# Patient Record
Sex: Male | Born: 1990 | Race: Black or African American | Hispanic: No | Marital: Single | Smoking: Former smoker
Health system: Southern US, Community
[De-identification: ages and names within clinical notes are randomized; demographics above are authoritative.]

## PROBLEM LIST (undated history)

## (undated) DIAGNOSIS — U071 COVID-19: Secondary | ICD-10-CM

## (undated) HISTORY — DX: COVID-19: U07.1

## (undated) HISTORY — PX: OTHER SURGICAL HISTORY: SHX169

## (undated) HISTORY — PX: HERNIA REPAIR: SHX51

---

## 2016-05-28 ENCOUNTER — Encounter (HOSPITAL_COMMUNITY): Payer: Self-pay | Admitting: Emergency Medicine

## 2016-05-28 ENCOUNTER — Emergency Department (HOSPITAL_COMMUNITY)
Admission: EM | Admit: 2016-05-28 | Discharge: 2016-05-28 | Disposition: A | Discharge status: Home / Self Care | Payer: Self-pay | Attending: Emergency Medicine | Admitting: Emergency Medicine

## 2016-05-28 DIAGNOSIS — L0201 Cutaneous abscess of face: Secondary | ICD-10-CM | POA: Insufficient documentation

## 2016-05-28 DIAGNOSIS — L0291 Cutaneous abscess, unspecified: Secondary | ICD-10-CM

## 2016-05-28 MED ORDER — ONDANSETRON HCL 4 MG PO TABS
4.0000 mg | ORAL_TABLET | Freq: Once | ORAL | Status: AC
  Administered 2016-05-28: 4 mg via ORAL
  Filled 2016-05-28: qty 1

## 2016-05-28 MED ORDER — IBUPROFEN 600 MG PO TABS: 600.0000 mg | ORAL_TABLET | Freq: Four times a day (QID) | ORAL | 0 refills | Status: DC

## 2016-05-28 MED ORDER — HYDROCODONE-ACETAMINOPHEN 5-325 MG PO TABS: 1.0000 | ORAL_TABLET | ORAL | 0 refills | Status: DC | PRN

## 2016-05-28 MED ORDER — DOXYCYCLINE HYCLATE 100 MG PO TABS
100.0000 mg | ORAL_TABLET | Freq: Once | ORAL | Status: AC
  Administered 2016-05-28: 100 mg via ORAL
  Filled 2016-05-28: qty 1

## 2016-05-28 MED ORDER — PENTAFLUOROPROP-TETRAFLUOROETH EX AERO
INHALATION_SPRAY | Freq: Once | CUTANEOUS | Status: AC
  Administered 2016-05-28: 11:00:00 via TOPICAL
  Filled 2016-05-28: qty 103.5

## 2016-05-28 MED ORDER — SULFAMETHOXAZOLE-TRIMETHOPRIM 800-160 MG PO TABS: 1.0000 | ORAL_TABLET | Freq: Two times a day (BID) | ORAL | 0 refills | Status: AC

## 2016-05-28 MED ORDER — ACETAMINOPHEN 500 MG PO TABS
1000.0000 mg | ORAL_TABLET | Freq: Once | ORAL | Status: AC
  Administered 2016-05-28: 1000 mg via ORAL
  Filled 2016-05-28: qty 2

## 2016-05-28 MED ORDER — IBUPROFEN 800 MG PO TABS
800.0000 mg | ORAL_TABLET | Freq: Once | ORAL | Status: AC
  Administered 2016-05-28: 800 mg via ORAL
  Filled 2016-05-28: qty 1

## 2016-05-28 NOTE — ED Triage Notes (Signed)
Pt reports 2 abscesses on neck bilaterally.  Began 2 days ago.

## 2016-05-28 NOTE — Discharge Instructions (Signed)
Please cleanse the 2 areas on the side of your jaw and face with warm water and soap on. Dry thoroughly, and apply a Band-Aid to each. Do this until these wounds have healed. Please use Bactrim 2 times daily with food. Use ibuprofen with breakfast, lunch, dinner, and at bedtime. Use Norco for more severe pain. Norco may cause drowsiness, please do not drive, drink, operate machinery, or participate in activities requiring concentration when taking this medication. Please see your primary physician, or return to the emergency department if any signs of advancing infection or change in your general condition.

## 2016-05-28 NOTE — ED Provider Notes (Signed)
[facility]-EMERGENCY DEPT Provider Note   CSN: [id number] Arrival date & time: 05/28/16  1003     History   Chief Complaint Chief Complaint  Patient presents with  . Abscess    HPI Adam Rogers is a 25 y.o. male.  Patient is a 25 year old male who presents to the emergency department with complaint of abscess of the neck and face.  The patient states that approximately 2 days ago he noticed a shaving bump on the right and the left side of his jaw and one on the upper portion of his neck. He states that he tried warm compresses. He tried squeezing the area. Today he has an abscess on either side of that is very painful. He has not noted red streaking noted. He is not had any difficulty with swallowing or speaking. He does not have any medical conditions that would interfere with his immune system. He denies diabetes. He presents now for assistance with this issue.   The history is provided by the patient.  Abscess    History reviewed. No pertinent past medical history.  There are no active problems to display for this patient.   History reviewed. No pertinent surgical history.     Home Medications    Prior to Admission medications   Not on File    Family History History reviewed. No pertinent family history.  Social History Social History  Substance Use Topics  . Smoking status: Never Smoker  . Smokeless tobacco: Never Used  . Alcohol use No     Allergies   Patient has no known allergies.   Review of Systems Review of Systems  Constitutional: Negative for activity change.       All ROS Neg except as noted in HPI  HENT: Negative for nosebleeds.   Eyes: Negative for photophobia and discharge.  Respiratory: Negative for cough, shortness of breath and wheezing.   Cardiovascular: Negative for chest pain and palpitations.  Gastrointestinal: Negative for abdominal pain and blood in stool.  Genitourinary: Negative for dysuria, frequency and hematuria.    Musculoskeletal: Negative for arthralgias, back pain and neck pain.  Skin: Positive for wound.  Neurological: Negative for dizziness, seizures and speech difficulty.  Psychiatric/Behavioral: Negative for confusion and hallucinations.     Physical Exam Updated Vital Signs BP 126/69   Pulse 79   Temp 98.7 F (37.1 C) (Oral)   Resp 18   Ht 6' (1.829 m)   Wt 65.8 kg   SpO2 100%   BMI 19.67 kg/m   Physical Exam  Constitutional: He is oriented to person, place, and time. He appears well-developed and well-nourished.  Non-toxic appearance.  HENT:  Head: Normocephalic.    Right Ear: Tympanic membrane and external ear normal.  Left Ear: Tympanic membrane and external ear normal.  Eyes: EOM and lids are normal. Pupils are equal, round, and reactive to light.  Neck: Normal range of motion. Neck supple. Carotid bruit is not present.  Cardiovascular: Normal rate, regular rhythm, normal heart sounds, intact distal pulses and normal pulses.   Pulmonary/Chest: Breath sounds normal. No respiratory distress.  Abdominal: Soft. Bowel sounds are normal. There is no tenderness. There is no guarding.  Musculoskeletal: Normal range of motion.  Lymphadenopathy:       Head (right side): No submandibular adenopathy present.       Head (left side): No submandibular adenopathy present.    He has no cervical adenopathy.  Neurological: He is alert and oriented to person, place, and time. He has  normal strength. No cranial nerve deficit or sensory deficit.  Skin: Skin is warm and dry.  Psychiatric: He has a normal mood and affect. His speech is normal.  Nursing note and vitals reviewed.    ED Treatments / Results  Labs (all labs ordered are listed, but only abnormal results are displayed) Labs Reviewed  AEROBIC CULTURE (SUPERFICIAL SPECIMEN)    EKG  EKG Interpretation None       Radiology No results found.  Procedures .Marland Kitchen.Incision and Drainage Date/Time: 05/28/2016 11:54  AM Performed by: Ivery QualeBRYANT, Alexie Lanni Authorized by: Ivery QualeBRYANT, Gwenette Wellons   Consent:    Consent obtained:  Verbal   Consent given by:  Patient   Risks discussed:  Bleeding, incomplete drainage and pain   Alternatives discussed:  Referral Location:    Type:  Abscess   Location: right and left face/jaw. Pre-procedure details:    Skin preparation:  Chloraprep Anesthesia (see MAR for exact dosages):    Anesthesia method:  Topical application   Topical anesthesia: Gebaurer Spray. Procedure type:    Complexity:  Simple Procedure details:    Incision types:  Stab incision   Incision depth:  Dermal   Scalpel blade:  11   Wound management:  Probed and deloculated   Drainage:  Purulent and bloody   Wound treatment:  Wound left open   Packing materials:  None Post-procedure details:    Patient tolerance of procedure:  Tolerated well, no immediate complications   (including critical care time)  Medications Ordered in ED Medications  doxycycline (VIBRA-TABS) tablet 100 mg (not administered)  acetaminophen (TYLENOL) tablet 1,000 mg (not administered)  ibuprofen (ADVIL,MOTRIN) tablet 800 mg (not administered)  ondansetron (ZOFRAN) tablet 4 mg (not administered)  pentafluoroprop-tetrafluoroeth (GEBAUERS) aerosol (not administered)     Initial Impression / Assessment and Plan / ED Course  I have reviewed the triage vital signs and the nursing notes.  Pertinent labs & imaging results that were available during my care of the patient were reviewed by me and considered in my medical decision making (see chart for details).  Clinical Course     **I have reviewed nursing notes, vital signs, and all appropriate lab and imaging results for this patient.*  Final Clinical Impressions(s) / ED Diagnoses   Final diagnoses:  Abscess    New Prescriptions Discharge Medication List as of 05/28/2016 11:51 AM    START taking these medications   Details  HYDROcodone-acetaminophen (NORCO/VICODIN) 5-325  MG tablet Take 1 tablet by mouth every 4 (four) hours as needed., Starting Mon 05/28/2016, Print    ibuprofen (ADVIL,MOTRIN) 600 MG tablet Take 1 tablet (600 mg total) by mouth 4 (four) times daily., Starting Mon 05/28/2016, Print    sulfamethoxazole-trimethoprim (BACTRIM DS,SEPTRA DS) 800-160 MG tablet Take 1 tablet by mouth 2 (two) times daily., Starting Mon 05/28/2016, Until Mon 06/04/2016, Print         Ivery QualeHobson Nara Paternoster, PA-C 05/28/16 1158    Margarita Grizzleanielle Ray, MD 05/28/16 1501

## 2016-05-31 LAB — AEROBIC CULTURE W GRAM STAIN (SUPERFICIAL SPECIMEN)

## 2016-05-31 LAB — AEROBIC CULTURE  (SUPERFICIAL SPECIMEN): SPECIAL REQUESTS: NORMAL

## 2016-06-01 ENCOUNTER — Telehealth (HOSPITAL_BASED_OUTPATIENT_CLINIC_OR_DEPARTMENT_OTHER): Payer: Self-pay

## 2016-06-01 NOTE — Telephone Encounter (Signed)
Post ED Visit - Positive Culture Follow-up  Culture report reviewed by antimicrobial stewardship pharmacist:  []  Enzo BiNathan Batchelder, Pharm.D. []  Celedonio MiyamotoJeremy Frens, Pharm.D., BCPS []  Garvin FilaMike Maccia, Pharm.D. []  Georgina PillionElizabeth Martin, Pharm.D., BCPS []  MarathonMinh Pham, [address]Ph[address]D., BCPS, AAHIVP []  Estella HuskMichelle Turner, Pharm.D., BCPS, AAHIVP []  Tennis Mustassie Stewart, Pharm.D. []  Sherle Poeob Vincent, [address]Ph[address]Milas Gain. X  James Johnston, Pharm.D.  Positive aerobic culture -> rare staph aureus Treated with sulfa trimeth, organism sensitive to the same and no further patient follow-up is required at this time.  Arvid RightClark, Winni Ehrhard Dorn 06/01/2016, 5:08 PM

## 2020-07-09 ENCOUNTER — Emergency Department (HOSPITAL_COMMUNITY): Payer: Self-pay

## 2020-07-09 ENCOUNTER — Encounter (HOSPITAL_COMMUNITY): Payer: Self-pay

## 2020-07-09 ENCOUNTER — Other Ambulatory Visit: Payer: Self-pay

## 2020-07-09 ENCOUNTER — Emergency Department (HOSPITAL_COMMUNITY)
Admission: EM | Admit: 2020-07-09 | Discharge: 2020-07-09 | Disposition: A | Discharge status: Home / Self Care | Payer: Self-pay | Attending: Emergency Medicine | Admitting: Emergency Medicine

## 2020-07-09 DIAGNOSIS — Z8616 Personal history of COVID-19: Secondary | ICD-10-CM | POA: Insufficient documentation

## 2020-07-09 DIAGNOSIS — R0789 Other chest pain: Secondary | ICD-10-CM | POA: Insufficient documentation

## 2020-07-09 LAB — BASIC METABOLIC PANEL
Anion gap: 9 (ref 5–15)
BUN: 7 mg/dL (ref 6–20)
CO2: 25 mmol/L (ref 22–32)
Calcium: 9.2 mg/dL (ref 8.9–10.3)
Chloride: 99 mmol/L (ref 98–111)
Creatinine, Ser: 0.95 mg/dL (ref 0.61–1.24)
GFR, Estimated: >60 mL/min (ref >60)
Glucose, Bld: 103 mg/dL — ABNORMAL HIGH (ref 70–99)
Potassium: 3.7 mmol/L (ref 3.5–5.1)
Sodium: 133 mmol/L — ABNORMAL LOW (ref 135–145)

## 2020-07-09 LAB — CBC
HCT: 47.3 % (ref 39.0–52.0)
Hemoglobin: 15.9 g/dL (ref 13.0–17.0)
MCH: 31.9 pg (ref 26.0–34.0)
MCHC: 33.6 g/dL (ref 30.0–36.0)
MCV: 94.8 fL (ref 80.0–100.0)
Platelets: 237 10*3/uL (ref 150–400)
RBC: 4.99 MIL/uL (ref 4.22–5.81)
RDW: 12 % (ref 11.5–15.5)
WBC: 6.6 10*3/uL (ref 4.0–10.5)
nRBC: 0 % (ref 0.0–0.2)

## 2020-07-09 LAB — TROPONIN I (HIGH SENSITIVITY)
Troponin I (High Sensitivity): <2 ng/L (ref <18)
Troponin I (High Sensitivity): <2 ng/L (ref <18)

## 2020-07-09 MED ORDER — IOHEXOL 350 MG/ML SOLN
75.0000 mL | Freq: Once | INTRAVENOUS | Status: AC | PRN
  Administered 2020-07-09: 75 mL via INTRAVENOUS

## 2020-07-09 NOTE — Discharge Instructions (Addendum)
Work-up today very normal.  No evidence of any stress on the heart.  CAT scan of the lungs showed no blood clots in the lungs no signs of pneumonia.  Make an appointment to follow-up with cardiology for further work-up.

## 2020-07-09 NOTE — ED Triage Notes (Signed)
Pt reports chest pain for one year that comes and goes. Pt reports this pain he currently has started Monday and described as sharp

## 2020-07-09 NOTE — ED Provider Notes (Signed)
[facility] EMERGENCY DEPARTMENT Provider Note   CSN: [id number] Arrival date & time: 07/09/20  1829     History Chief Complaint  Patient presents with  . Chest Pain    Adam Rogers is a 30 y.o. male.  Patient diagnosed with Covid infection January 8.  Patient had some ongoing chest pain for the past several months even prior to that diagnosis.  But is gotten worse since.  Patient's been evaluated at [facility] for this.  Does not appear that he had a CT of his chest.  Patient states that when the pain occurs it is left anterior it can last for hours.  But it is not there every day.  Denies any leg swelling denies any shortness of breath.  Patient is very concerned that something is wrong        History reviewed. No pertinent past medical history.  There are no problems to display for this patient.   History reviewed. No pertinent surgical history.     No family history on file.  Social History   Tobacco Use  . Smoking status: Never Smoker  . Smokeless tobacco: Never Used  Substance Use Topics  . Alcohol use: No  . Drug use: No    Home Medications Prior to Admission medications   Medication Sig Start Date End Date Taking? Authorizing Provider  HYDROcodone-acetaminophen (NORCO/VICODIN) 5-325 MG tablet Take 1 tablet by mouth every 4 (four) hours as needed. 05/28/16   Ivery Quale, PA-C  ibuprofen (ADVIL,MOTRIN) 600 MG tablet Take 1 tablet (600 mg total) by mouth 4 (four) times daily. 05/28/16   Ivery Quale, PA-C    Allergies    Patient has no known allergies.  Review of Systems   Review of Systems  Constitutional: Negative for chills and fever.  HENT: Negative for congestion, rhinorrhea and sore throat.   Eyes: Negative for visual disturbance.  Respiratory: Negative for cough and shortness of breath.   Cardiovascular: Positive for chest pain. Negative for leg swelling.  Gastrointestinal: Negative for abdominal pain, diarrhea, nausea and vomiting.   Genitourinary: Negative for dysuria.  Musculoskeletal: Negative for back pain and neck pain.  Skin: Negative for rash.  Neurological: Negative for dizziness, light-headedness and headaches.  Hematological: Does not bruise/bleed easily.  Psychiatric/Behavioral: Negative for confusion.    Physical Exam Updated Vital Signs BP 125/87   Pulse 72   Temp 99.2 F (37.3 C) (Oral)   Resp 16   Ht 1.803 m (5\' 11" )   Wt 65.8 kg   SpO2 99%   BMI 20.22 kg/m   Physical Exam Vitals and nursing note reviewed.  Constitutional:      General: He is not in acute distress.    Appearance: He is well-developed and well-nourished.  HENT:     Head: Normocephalic and atraumatic.  Eyes:     Extraocular Movements: Extraocular movements intact.     Conjunctiva/sclera: Conjunctivae normal.     Pupils: Pupils are equal, round, and reactive to light.  Cardiovascular:     Rate and Rhythm: Normal rate and regular rhythm.     Heart sounds: No murmur heard.   Pulmonary:     Effort: Pulmonary effort is normal. No respiratory distress.     Breath sounds: Normal breath sounds.  Chest:     Chest wall: No tenderness.  Abdominal:     Palpations: Abdomen is soft.     Tenderness: There is no abdominal tenderness.  Musculoskeletal:        General: No  edema.     Cervical back: Neck supple.     Right lower leg: No edema.     Left lower leg: No edema.  Skin:    General: Skin is warm and dry.     Capillary Refill: Capillary refill takes less than 2 seconds.  Neurological:     General: No focal deficit present.     Mental Status: He is alert and oriented to person, place, and time.     Cranial Nerves: No cranial nerve deficit.     Sensory: No sensory deficit.  Psychiatric:        Mood and Affect: Mood and affect normal.     ED Results / Procedures / Treatments   Labs (all labs ordered are listed, but only abnormal results are displayed) Labs Reviewed  BASIC METABOLIC PANEL - Abnormal; Notable for  the following components:      Result Value   Sodium 133 (*)    Glucose, Bld 103 (*)    All other components within normal limits  CBC  TROPONIN I (HIGH SENSITIVITY)  TROPONIN I (HIGH SENSITIVITY)    EKG EKG Interpretation  Date/Time:  Saturday July 09 2020 10:44:55 EST Ventricular Rate:  79 PR Interval:  142 QRS Duration: 84 QT Interval:  384 QTC Calculation: 440 R Axis:   91 Text Interpretation: Normal sinus rhythm Rightward axis Borderline ECG No previous ECGs available Confirmed by Vanetta Mulders [phone]) on 07/09/2020 11:14:05 AM   Radiology DG Chest 2 View  Result Date: 07/09/2020 CLINICAL DATA:  Chest pain for 1 year, worse for 4 days EXAM: CHEST - 2 VIEW COMPARISON:  None. FINDINGS: Normal heart size and mediastinal contours. No acute infiltrate or edema. Artifact from EKG leads. No effusion or pneumothorax. No acute osseous findings. IMPRESSION: No active cardiopulmonary disease. Electronically Signed   By: Marnee Spring M.D.   On: 07/09/2020 11:09   CT Angio Chest PE W/Cm &/Or Wo Cm  Result Date: 07/09/2020 CLINICAL DATA:  Suspected PE. Left-sided chest pain for 1 year, worsening in the last 4 days. EXAM: CT ANGIOGRAPHY CHEST WITH CONTRAST TECHNIQUE: Multidetector CT imaging of the chest was performed using the standard protocol during bolus administration of intravenous contrast. Multiplanar CT image reconstructions and MIPs were obtained to evaluate the vascular anatomy. CONTRAST:  41mL OMNIPAQUE IOHEXOL 350 MG/ML SOLN COMPARISON:  None. FINDINGS: Cardiovascular: Satisfactory opacification of the pulmonary arteries to the segmental level. No evidence of pulmonary embolism. Normal heart size. No pericardial effusion. Mediastinum/Nodes: No enlarged mediastinal, hilar, or axillary lymph nodes. Thyroid gland, trachea, and esophagus demonstrate no significant findings. Lungs/Pleura: Lungs are clear. No pleural effusion or pneumothorax. Upper Abdomen: No acute abnormality.  Musculoskeletal: No chest wall abnormality. No acute or significant osseous findings. Review of the MIP images confirms the above findings. IMPRESSION: No evidence of pulmonary embolus or other acute abnormality within the thorax. Electronically Signed   By: Ted Mcalpine M.D.   On: 07/09/2020 13:17    Procedures Procedures   Medications Ordered in ED Medications  iohexol (OMNIPAQUE) 350 MG/ML injection 75 mL (75 mLs Intravenous Contrast Given 07/09/20 1312)    ED Course  I have reviewed the triage vital signs and the nursing notes.  Pertinent labs & imaging results that were available during my care of the patient were reviewed by me and considered in my medical decision making (see chart for details).    MDM Rules/Calculators/A&P  Work-up here troponins x2 - EKG without any acute findings.  Regular chest x-ray without any acute findings.  Went on and did CT angio to rule out pulmonary embolus since he recently had Covid infection.  No evidence of any pneumonia no evidence of pulmonary embolus.  Patient's basic labs without any significant abnormalities.  No leukocytosis no anemia.  Patient stable for discharge home.  Will give referral to cardiology to consider doing echocardiogram.  Not sure if they wanted do a stress test or not at his age.  But patient stable for discharge home.  Final Clinical Impression(s) / ED Diagnoses Final diagnoses:  Atypical chest pain    Rx / DC Orders ED Discharge Orders    None       Vanetta Mulders, MD 07/09/20 1438

## 2020-07-11 ENCOUNTER — Telehealth: Payer: Self-pay

## 2020-07-13 NOTE — Telephone Encounter (Signed)
error 

## 2020-08-16 ENCOUNTER — Ambulatory Visit: Payer: Self-pay | Admitting: Internal Medicine

## 2020-08-19 ENCOUNTER — Encounter: Payer: Self-pay | Admitting: Cardiology

## 2020-08-19 NOTE — Progress Notes (Signed)
Cardiology Office Note  Date: 08/22/2020   ID: Adam Rogers, DOB [birth year], MRN [id number]  PCP:  Patient, No Pcp Per  Cardiologist:  Nona Dell, MD Electrophysiologist:  None   Chief Complaint  Patient presents with  . Recurrent chest pain    History of Present Illness: Adam Rogers is a 30 y.o. male referred for cardiology consultation by Dr. Deretha Emory after ER visit in February with chest discomfort.  He tells me that over the last year he has been experiencing a tightness on the left side of his chest, generally sporadic and not precipitated by exertion.  Sometimes he states that it bothers him so much that he "cannot eat."  He does not describe dysphagia, odynophagia, or reflux.  Some days he feels completely normal.  He cannot identify any other specific precipitant.  No associated palpitations.  No syncope.  He was diagnosed with COVID-19 in January.  Did not require hospitalization.  Feels like the above-mentioned symptoms have been worse since then.  Chest CTA in February showed no acute findings as noted below.  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   Past Medical History:  Diagnosis Date  . COVID-22 June 2020    Past Surgical History:  Procedure Laterality Date  . Arm surgery Left Age 49  . HERNIA REPAIR  Age 67   Medications:  No prescription medications  Allergies:  Patient has no known allergies.   Social History: The patient  reports that he quit smoking about 6 weeks ago. His smoking use included cigarettes. He has never used smokeless tobacco. He reports that he does not drink alcohol and does not use drugs.   Family History: The patient's family history includes Asthma in his mother.   ROS: No palpitations or syncope.  Physical Exam: VS:  BP 114/74 (BP Location: Left Arm, Patient Position: Sitting, Cuff Size: Normal)   Pulse 82   Ht 5\' 11"  (1.803 m)   Wt 151 lb 6.4 oz (68.7 kg)   SpO2 98%   BMI 21.12 kg/m ,  BMI Body mass index is 21.12 kg/m.  Wt Readings from Last 3 Encounters:  08/22/20 151 lb 6.4 oz (68.7 kg)  07/09/20 145 lb (65.8 kg)  05/28/16 145 lb (65.8 kg)    General: Patient appears comfortable at rest. HEENT: Conjunctiva and lids normal, wearing a mask. Neck: Supple, no elevated JVP or carotid bruits. Lungs: Clear to auscultation, nonlabored breathing at rest. Cardiac: Regular rate and rhythm, no S3 or significant systolic murmur, no pericardial rub. Abdomen: Soft, nontender, bowel sounds present. Extremities: No pitting edema, distal pulses 2+. Skin: Warm and dry. Musculoskeletal: No kyphosis. Neuropsychiatric: Alert and oriented x3, affect grossly appropriate.  ECG:  An ECG dated 07/09/2020 was personally reviewed today and demonstrated:  Sinus rhythm with rightward axis, lead motion artifact.  Recent Labwork: 07/09/2020: BUN 7; Creatinine, Ser 0.95; Hemoglobin 15.9; Platelets 237; Potassium 3.7; Sodium 133  January 2022: High-sensitivity troponin I 5, TSH 0.94, SARS coronavirus 2 PCR positive  Other Studies Reviewed Today:  Chest CTA 07/09/2020: FINDINGS: Cardiovascular: Satisfactory opacification of the pulmonary arteries to the segmental level. No evidence of pulmonary embolism. Normal heart size. No pericardial effusion.  Mediastinum/Nodes: No enlarged mediastinal, hilar, or axillary lymph nodes. Thyroid gland, trachea, and esophagus demonstrate no significant findings.  Lungs/Pleura: Lungs are clear. No pleural effusion or pneumothorax.  Upper Abdomen: No acute abnormality.  Musculoskeletal: No chest wall abnormality. No acute or significant osseous findings.  Review of the MIP images confirms the above findings.  IMPRESSION: No evidence of pulmonary embolus or other acute abnormality within the thorax.  Assessment and Plan:  Atypical chest pain as outlined above.  Symptoms occurring over the last year, no obvious precipitant.  Does seem to be more  prominent since having COVID-19 in January.  No associated palpitations or syncope.  Recent follow-up chest CT in February showed no acute findings however.  High-sensitivity troponin I levels also normal.  I reviewed his ECG.  We will obtain an echocardiogram to ensure structurally normal heart, also GXT although ischemia is not of high likelihood based on symptom description at this point.  Medication Adjustments/Labs and Tests Ordered: Current medicines are reviewed at length with the patient today.  Concerns regarding medicines are outlined above.   Tests Ordered: Orders Placed This Encounter  Procedures  . EXERCISE TOLERANCE TEST (ETT)  . ECHOCARDIOGRAM COMPLETE    Medication Changes: No orders of the defined types were placed in this encounter.   Disposition:  Follow up test results.  Signed, Jonelle Sidle, MD, [facility] 08/22/2020 9:24 AM    [facility] [address] [place], [place], [place] [address] Phone: [phone]; Fax: [phone]

## 2020-08-22 ENCOUNTER — Ambulatory Visit (INDEPENDENT_AMBULATORY_CARE_PROVIDER_SITE_OTHER): Payer: Self-pay | Admitting: Cardiology

## 2020-08-22 ENCOUNTER — Encounter: Payer: Self-pay | Admitting: *Deleted

## 2020-08-22 ENCOUNTER — Encounter: Payer: Self-pay | Admitting: Cardiology

## 2020-08-22 VITALS — BP 114/74 | HR 82 | Ht 71.0 in | Wt 151.4 lb

## 2020-08-22 DIAGNOSIS — Z87898 Personal history of other specified conditions: Secondary | ICD-10-CM

## 2020-08-22 DIAGNOSIS — Z8616 Personal history of COVID-19: Secondary | ICD-10-CM

## 2020-08-22 DIAGNOSIS — R079 Chest pain, unspecified: Secondary | ICD-10-CM

## 2020-08-22 DIAGNOSIS — R0789 Other chest pain: Secondary | ICD-10-CM

## 2020-08-22 NOTE — Patient Instructions (Addendum)
Medication Instructions:   Your physician recommends that you continue on your current medications as directed. Please refer to the Current Medication list given to you today.  Labwork:  Covid Test 2-3 days before stress test-Quarantine after covid test until stress test complete  Testing/Procedures: Your physician has requested that you have an echocardiogram. Echocardiography is a painless test that uses sound waves to create images of your heart. It provides your doctor with information about the size and shape of your heart and how well your heart's chambers and valves are working. This procedure takes approximately one hour. There are no restrictions for this procedure. Your physician has requested that you have an exercise tolerance test. For further information please visit [email or web address] Please also follow instruction sheet, as given.  Follow-Up:  Your physician recommends that you schedule a follow-up appointment in: pending.  Any Other Special Instructions Will Be Listed Below (If Applicable).  If you need a refill on your cardiac medications before your next appointment, please call your pharmacy.

## 2020-09-09 ENCOUNTER — Other Ambulatory Visit (HOSPITAL_COMMUNITY)
Admission: RE | Admit: 2020-09-09 | Discharge: 2020-09-09 | Disposition: A | Discharge status: Home / Self Care | Payer: Self-pay | Source: Ambulatory Visit | Attending: Cardiology | Admitting: Cardiology

## 2020-09-12 ENCOUNTER — Other Ambulatory Visit: Payer: Self-pay

## 2020-09-12 ENCOUNTER — Ambulatory Visit (HOSPITAL_COMMUNITY)
Admission: RE | Admit: 2020-09-12 | Discharge: 2020-09-12 | Disposition: A | Discharge status: Home / Self Care | Payer: Self-pay | Source: Ambulatory Visit | Attending: Cardiology | Admitting: Cardiology

## 2020-09-12 DIAGNOSIS — R0789 Other chest pain: Secondary | ICD-10-CM

## 2020-09-12 DIAGNOSIS — R079 Chest pain, unspecified: Secondary | ICD-10-CM | POA: Insufficient documentation

## 2020-09-12 LAB — EXERCISE TOLERANCE TEST
Estimated workload: 11.7 METS
Exercise duration (min): 9 min
Exercise duration (sec): 31 s
MPHR: 189 {beats}/min
Peak HR: 179 {beats}/min
Percent HR: 94 %
Rest HR: 74 {beats}/min

## 2020-09-12 LAB — ECHOCARDIOGRAM COMPLETE
Area-P 1/2: 3.85 cm2
S' Lateral: 3.5 cm

## 2020-09-12 NOTE — Progress Notes (Signed)
*  PRELIMINARY RESULTS* Echocardiogram 2D Echocardiogram has been performed.  Stacey Drain 09/12/2020, 11:08 AM

## 2020-09-13 ENCOUNTER — Telehealth: Payer: Self-pay | Admitting: Cardiology

## 2020-09-13 ENCOUNTER — Telehealth: Payer: Self-pay | Admitting: *Deleted

## 2020-09-13 NOTE — Telephone Encounter (Signed)
-----   Message from Jonelle Sidle, MD sent at 09/12/2020  5:47 PM EDT ----- Results reviewed. Please let him know the stress test was normal, no ischemic changes to suggest CAD and no arrhythmias. No further cardiac work-up at this point.

## 2020-09-13 NOTE — Telephone Encounter (Signed)
-----   Message from Jonelle Sidle, MD sent at 09/12/2020  5:45 PM EDT ----- Results reviewed. Echocardiogram shows normal LVEF at 55-60%, no valvular abnormalities.

## 2020-09-13 NOTE — Telephone Encounter (Signed)
Patient informed. Copy sent to PCP °

## 2020-09-13 NOTE — Telephone Encounter (Signed)
Patient returned Lydia's call fro  Results from Echo & Stress Test. Please call him back at #[phone].

## 2022-04-10 IMAGING — CT CT ANGIO CHEST
2 of 6 series · 19 of 46 positions shown · IV contrast (Omnipaque or Isovue)
Comparison: None.

CLINICAL DATA: Suspected PE.

Left-sided chest pain for 1 year, worsening in the last 4 days.
EXAM:
CT ANGIOGRAPHY CHEST WITH CONTRAST
TECHNIQUE: Multidetector CT imaging of the chest was performed using the
standard protocol during bolus administration of intravenous
contrast. Multiplanar CT image reconstructions and MIPs were
obtained to evaluate the vascular anatomy.
CONTRAST:  75mL OMNIPAQUE IOHEXOL 350 MG/ML SOLN

[Series 6: pe axial thins · axial · 0.63mm/px · z∈[+987,+1243]mm · 16 of 282 slices shown]
[im 13/282  lung]
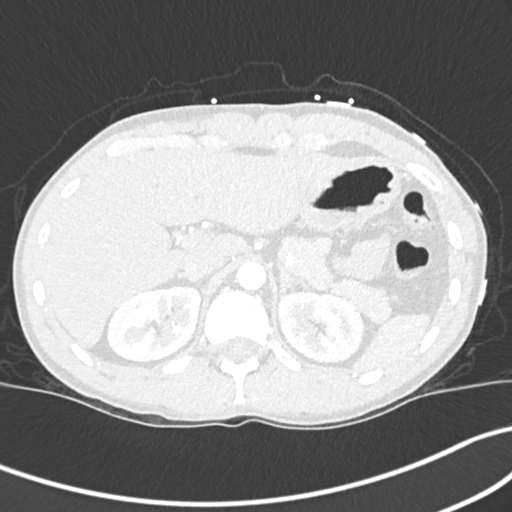
[im 37/282  soft-tissue]
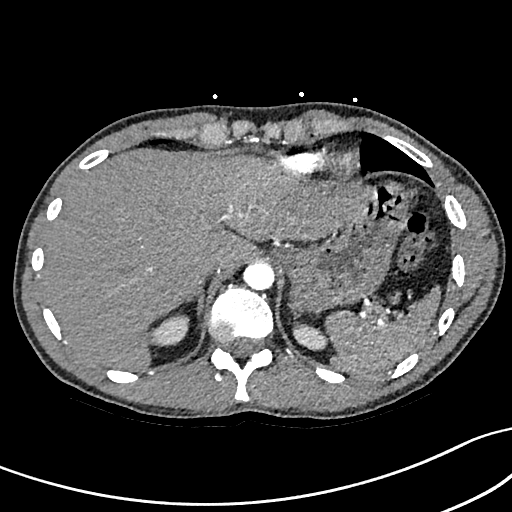
[im 49/282  lung]
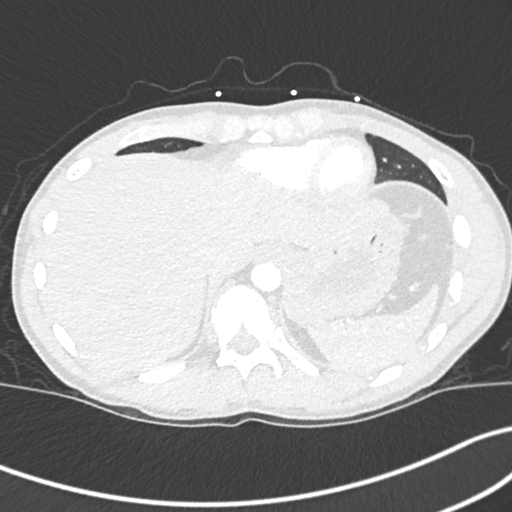
[im 62/282  soft-tissue]
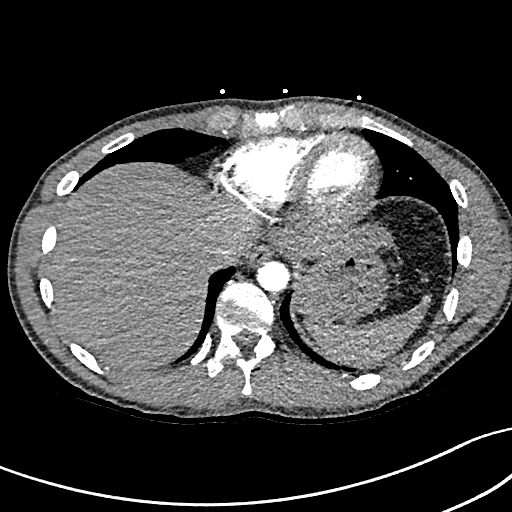
[im 86/282  lung]
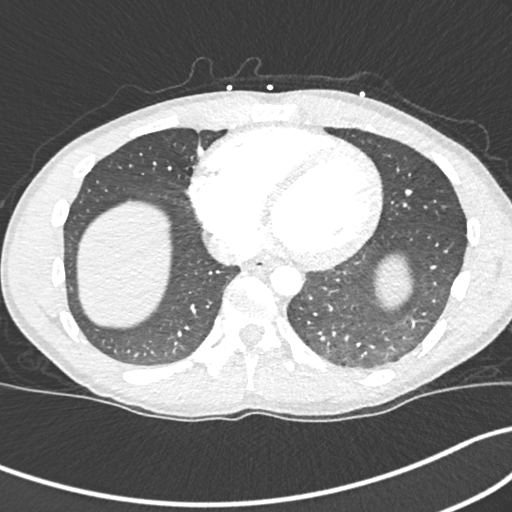
[im 98/282  soft-tissue]
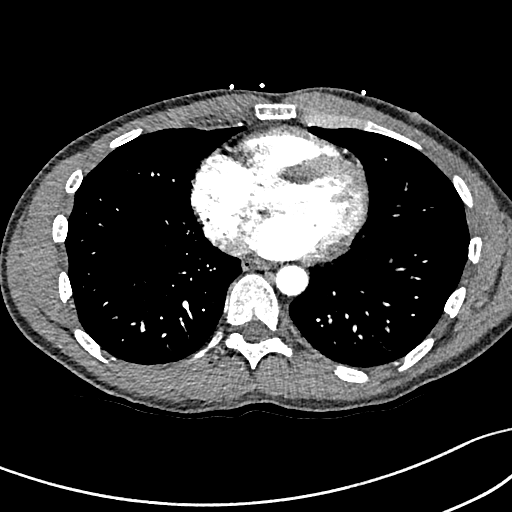
[im 110/282  lung]
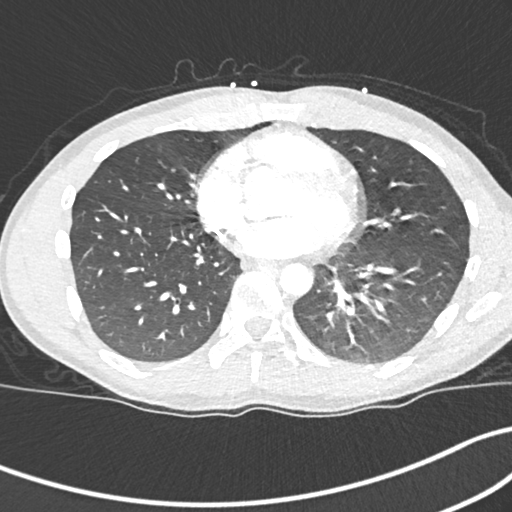
[im 135/282  soft-tissue]
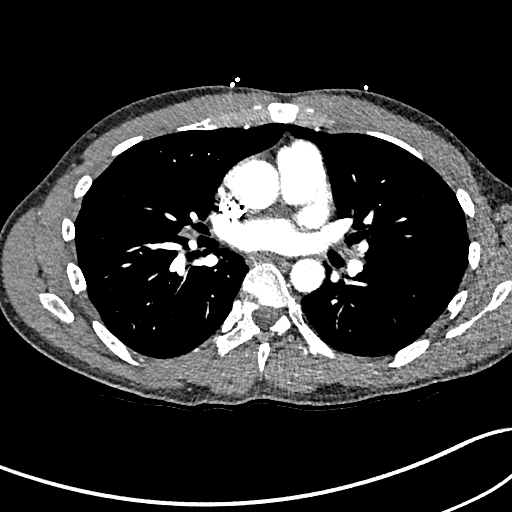
[im 147/282  lung]
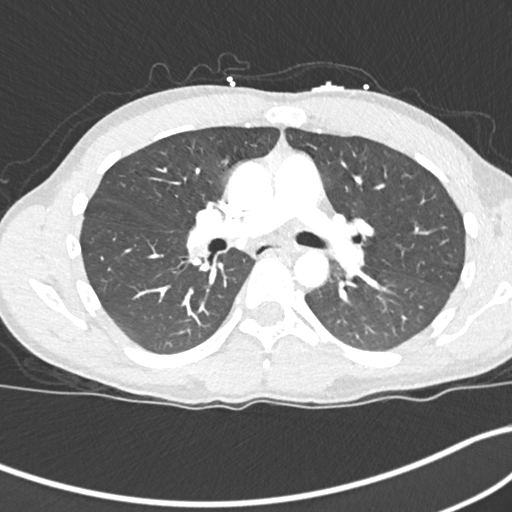
[im 172/282  soft-tissue]
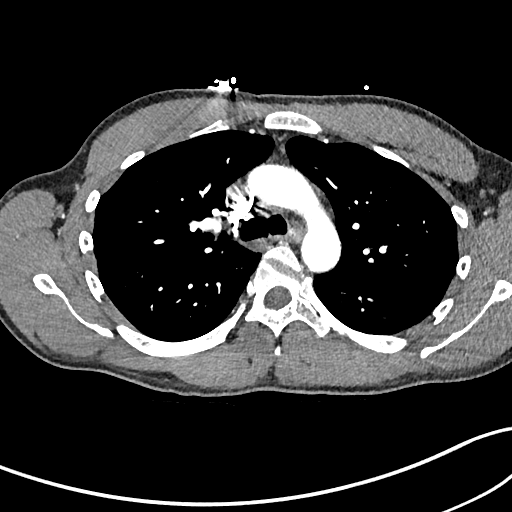
[im 184/282  lung]
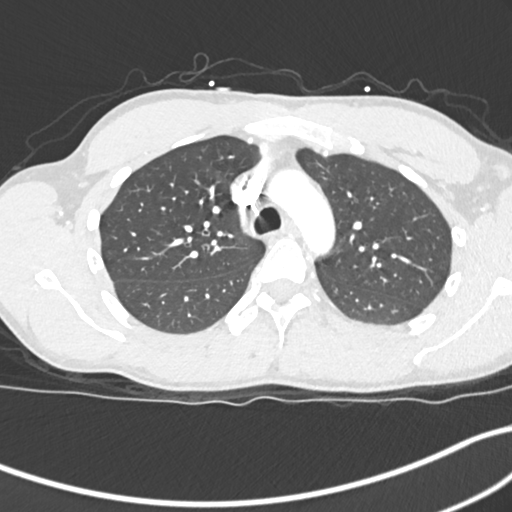
[im 196/282  soft-tissue]
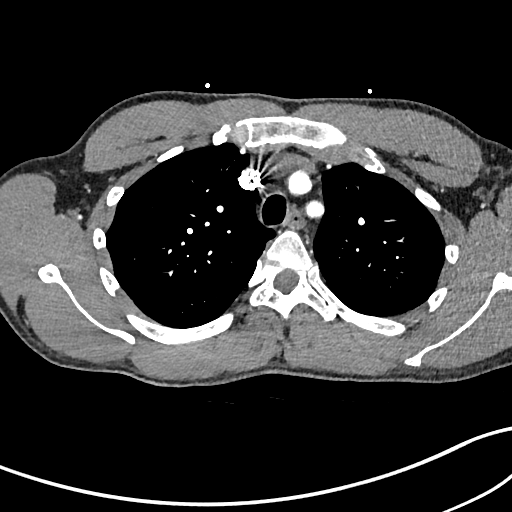
[im 220/282  lung]
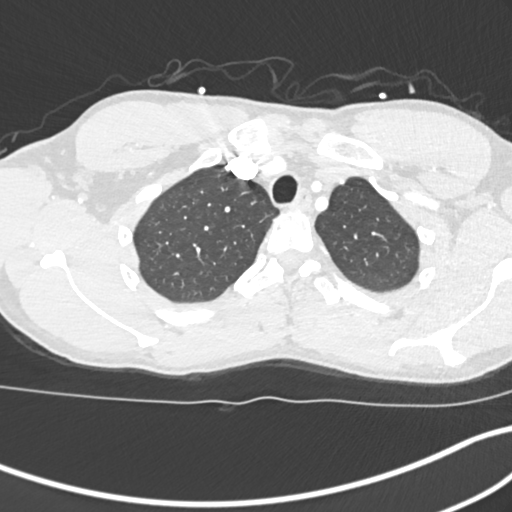
[im 233/282  soft-tissue]
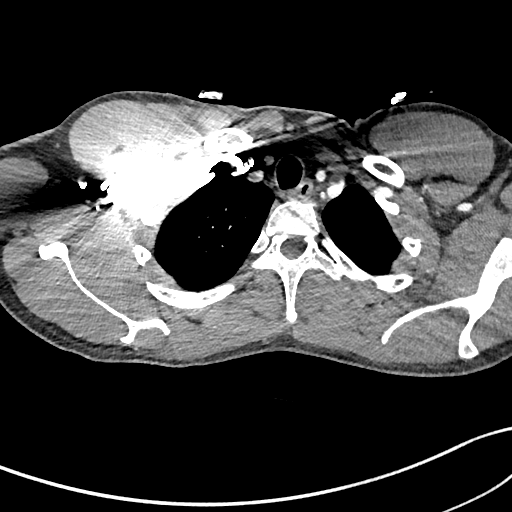
[im 245/282  lung]
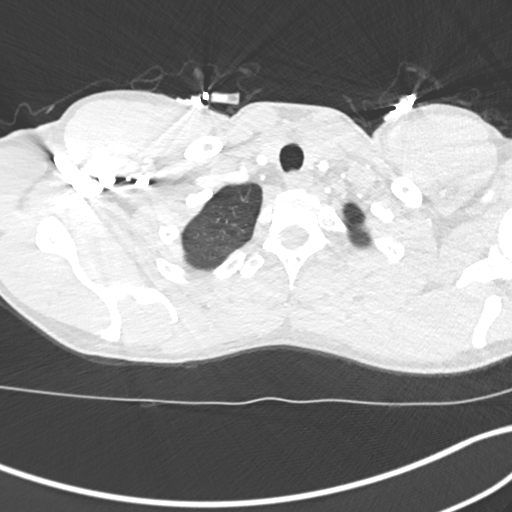
[im 269/282  soft-tissue]
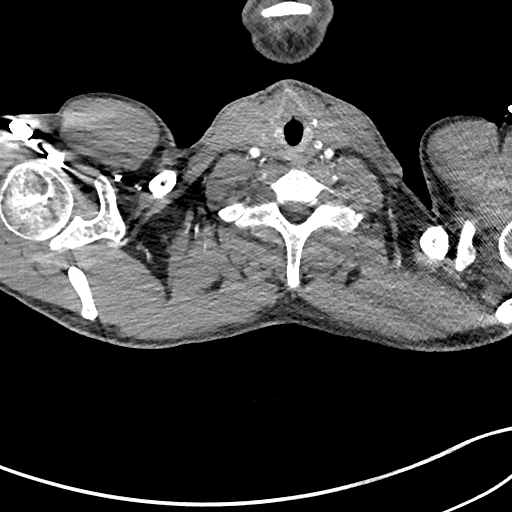

[Series 8: cor soft · coronal · 0.62mm/px · 3 of 123 slices shown]
[im 31/123  soft-tissue]
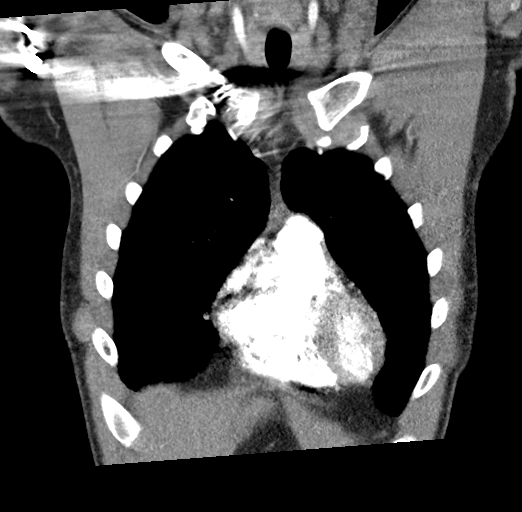
[im 62/123  soft-tissue]
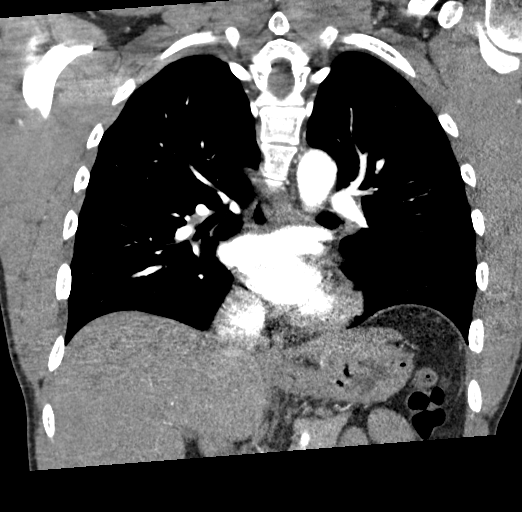
[im 92/123  soft-tissue]
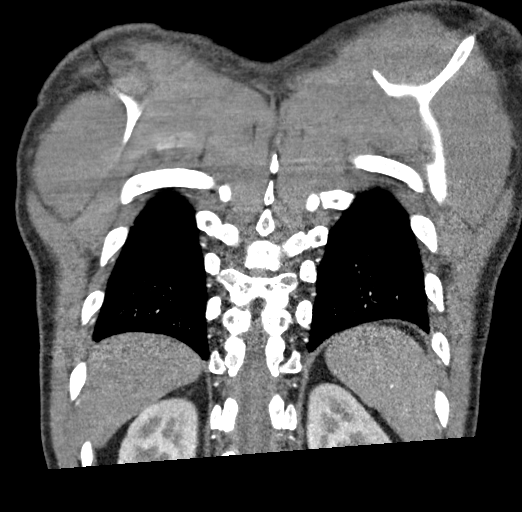

[19 of 46 positions shown; findings below may reference images not displayed]

FINDINGS: Cardiovascular: Satisfactory opacification of the pulmonary arteries
to the segmental level. No evidence of pulmonary embolism. Normal
heart size. No pericardial effusion.

Mediastinum/Nodes: No enlarged mediastinal, hilar, or axillary lymph
nodes. Thyroid gland, trachea, and esophagus demonstrate no
significant findings.

Lungs/Pleura: Lungs are clear. No pleural effusion or pneumothorax.

Upper Abdomen: No acute abnormality.

Musculoskeletal: No chest wall abnormality. No acute or significant
osseous findings.

Review of the MIP images confirms the above findings.
IMPRESSION: No evidence of pulmonary embolus or other acute abnormality within
the thorax.
